# Patient Record
Sex: Male | Born: 1955 | Race: White | Hispanic: No | Marital: Single | Smoking: Current every day smoker
Health system: Southern US, Community
[De-identification: ages and names within clinical notes are randomized; demographics above are authoritative.]

## PROBLEM LIST (undated history)

## (undated) DIAGNOSIS — K5090 Crohn's disease, unspecified, without complications: Secondary | ICD-10-CM

## (undated) HISTORY — DX: Crohn's disease, unspecified, without complications: K50.90

## (undated) HISTORY — PX: SMALL INTESTINE SURGERY: SHX150

---

## 2016-01-29 ENCOUNTER — Encounter: Payer: Self-pay | Admitting: Osteopathic Medicine

## 2016-01-29 ENCOUNTER — Ambulatory Visit (INDEPENDENT_AMBULATORY_CARE_PROVIDER_SITE_OTHER): Payer: Self-pay | Admitting: Osteopathic Medicine

## 2016-01-29 ENCOUNTER — Ambulatory Visit (INDEPENDENT_AMBULATORY_CARE_PROVIDER_SITE_OTHER): Payer: Self-pay

## 2016-01-29 VITALS — BP 135/80 | HR 79 | Ht 70.0 in | Wt 275.0 lb

## 2016-01-29 DIAGNOSIS — M545 Low back pain, unspecified: Secondary | ICD-10-CM

## 2016-01-29 DIAGNOSIS — I998 Other disorder of circulatory system: Secondary | ICD-10-CM

## 2016-01-29 DIAGNOSIS — W11XXXA Fall on and from ladder, initial encounter: Secondary | ICD-10-CM

## 2016-01-29 DIAGNOSIS — M4854XA Collapsed vertebra, not elsewhere classified, thoracic region, initial encounter for fracture: Secondary | ICD-10-CM

## 2016-01-29 DIAGNOSIS — R0781 Pleurodynia: Secondary | ICD-10-CM

## 2016-01-29 DIAGNOSIS — K5090 Crohn's disease, unspecified, without complications: Secondary | ICD-10-CM | POA: Insufficient documentation

## 2016-01-29 DIAGNOSIS — M25511 Pain in right shoulder: Secondary | ICD-10-CM

## 2016-01-29 DIAGNOSIS — M25551 Pain in right hip: Secondary | ICD-10-CM

## 2016-01-29 DIAGNOSIS — K50919 Crohn's disease, unspecified, with unspecified complications: Secondary | ICD-10-CM

## 2016-01-29 DIAGNOSIS — M4856XA Collapsed vertebra, not elsewhere classified, lumbar region, initial encounter for fracture: Secondary | ICD-10-CM

## 2016-01-29 DIAGNOSIS — IMO0001 Reserved for inherently not codable concepts without codable children: Secondary | ICD-10-CM

## 2016-01-29 DIAGNOSIS — F17200 Nicotine dependence, unspecified, uncomplicated: Secondary | ICD-10-CM | POA: Insufficient documentation

## 2016-01-29 DIAGNOSIS — M4850XA Collapsed vertebra, not elsewhere classified, site unspecified, initial encounter for fracture: Secondary | ICD-10-CM

## 2016-01-29 DIAGNOSIS — S46001A Unspecified injury of muscle(s) and tendon(s) of the rotator cuff of right shoulder, initial encounter: Secondary | ICD-10-CM

## 2016-01-29 MED ORDER — CYCLOBENZAPRINE HCL 10 MG PO TABS: ORAL_TABLET | ORAL | 0 refills | Status: DC

## 2016-01-29 MED ORDER — TRAMADOL HCL 50 MG PO TABS: ORAL_TABLET | ORAL | 0 refills | Status: DC

## 2016-01-29 MED ORDER — CYCLOBENZAPRINE HCL 10 MG PO TABS: 10.0000 mg | ORAL_TABLET | Freq: Three times a day (TID) | ORAL | 0 refills | Status: AC | PRN

## 2016-01-29 MED ORDER — TRAMADOL HCL 50 MG PO TABS: 50.0000 mg | ORAL_TABLET | Freq: Four times a day (QID) | ORAL | 0 refills | Status: AC | PRN

## 2016-01-29 NOTE — Progress Notes (Signed)
HPI: Caleb Graham is a 60 y.o. male  who presents to [facility] today, 01/29/16,  for chief complaint of:  Chief Complaint  Patient presents with  . Establish Care    Right arm pain due to a fall     . Context: fall from 10-12 foot fall (20 foot ladder but wasn't all the way up), no head injury or LOC   . Location: R arm can't lift above shoulder, rib pain on R when breathing/moving, low back pain  . Quality: sore/crampy, painful to move, difficulty raising R arm . Severity: doing a bit better than he was, is able to walk but has a lot of pain  . Duration: 9 days ago  . Assoc signs/symptoms: No numbness or tingling, no saddle anesthesia    Past medical, surgical, social and family history reviewed: Past Medical History:  Diagnosis Date  . Crohn disease [facility])    Past Surgical History:  Procedure Laterality Date  . SMALL INTESTINE SURGERY     30YEARS AGO   Social History  Substance Use Topics  . Smoking status: Current Every Day Smoker  . Smokeless tobacco: Never Used  . Alcohol use Not on file   Family History  Problem Relation Age of Onset  . Diabetes Maternal Grandmother      Current medication list and allergy/intolerance information reviewed:   No current outpatient prescriptions on file.   No current facility-administered medications for this visit.    No Known Allergies    Review of Systems:  Constitutional:  No  fever, no chills, No recent illness, No unintentional weight changes. No significant fatigue.   HEENT: No  headache, no vision change, no hearing change, No sore throat, No  sinus pressure  Cardiac: No  chest pain, No  pressure, No palpitations, No  Orthopnea  Respiratory:  No  shortness of breath. No  Cough  Gastrointestinal: No  abdominal pain, No  nausea, No  vomiting,  No  blood in stool, No  diarrhea, No  constipation   Musculoskeletal: (+)new myalgia/arthralgia  Genitourinary: No  incontinence, No   abnormal genital bleeding, No abnormal genital discharge  Skin: No  Rash, No other wounds/concerning lesions  Hem/Onc: No  easy bruising/bleeding, No  abnormal lymph node  Endocrine: No cold intolerance,  No heat intolerance. No polyuria/polydipsia/polyphagia   Neurologic: No  weakness, No  dizziness, No  slurred speech/focal weakness/facial droop  Psychiatric: No  concerns with depression, No  concerns with anxiety, No sleep problems, No mood problems  Exam:  BP 135/80   Pulse 79   Ht 5\' 10"  (1.778 m)   Wt 275 lb (124.7 kg)   BMI 39.46 kg/m   Constitutional: VS see above. General Appearance: alert, well-developed, well-nourished, NAD  Eyes: Normal lids and conjunctive, non-icteric sclera  Ears, Nose, Mouth, Throat: MMM, Normal external inspection ears/nares/mouth/lips/gums.  Neck: No masses, trachea midline. No thyroid enlargement. Normal neck range of motion, nontender C-spine  Respiratory: Normal respiratory effort. no wheeze, no rhonchi, no rales  Cardiovascular: S1/S2 normal, no murmur, no rub/gallop auscultated. RRR. No lower extremity edema but does appear to be some venous stasis changes in the skin  Gastrointestinal: Habitus limits exam. Nontender, no masses. No hepatomegaly, no splenomegaly. No hernia appreciated. Bowel sounds normal. Rectal exam deferred.   Musculoskeletal: Gait normal. No clubbing/cyanosis of digits. Paraspinal tenderness bilateral lower back, positives. Leg raise causes lower back pain, no sciatica symptoms. Significant bruising on right hip, pelvis stable, does have  some tenderness at she with bone. Normal grip excursion on inhalation. Positive drop arm test on right shoulder, difficult to say whether due to pain or weakness  Neurological: No cranial nerve deficit on limited exam. Able to move all 4 extremities independently. Cerebellar reflexes intact. Normal balance/coordination. No tremor.   Skin: warm, dry, intact. No rash/ulcer. No concerning  nevi or subq nodules on limited exam.  Positive bruising/ecchymoses on right hip  Psychiatric: Normal judgment/insight. Normal mood and affect. Oriented x3.    No results found for this or any previous visit (from the past 72 hour(s)).  Dg Ribs Unilateral W/chest Right  Result Date: 01/29/2016 CLINICAL DATA:  Fall. EXAM: RIGHT RIBS AND CHEST - 3+ VIEW COMPARISON:  No recent prior. FINDINGS: No evidence of fracture dislocation. No pneumothorax. Degenerative changes thoracic spine and both shoulders. Carotid vascular calcification. IMPRESSION: 1. No acute abnormality. Degenerative changes thoracic spine and both shoulders. 2. Carotid vascular disease. Electronically Signed   By: Maisie Fus  Register   On: 01/29/2016 11:33   Dg Lumbar Spine Complete  Result Date: 01/29/2016 CLINICAL DATA:  Fall. EXAM: LUMBAR SPINE - COMPLETE 4+ VIEW COMPARISON:  No recent prior. FINDINGS: Diffuse multilevel degenerative changes thoracolumbar spine. Mild compression fractures of T10, T11, T12, L1, and L4 noted. Age of compression fractures undetermined. IMPRESSION: 1. Mild compression fractures at T10, T11, T12, L1, L4 noted. Age of compression fractures is indeterminate. 2. Diffuse degenerative change. Electronically Signed   By: Maisie Fus  Register   On: 01/29/2016 11:36   Dg Shoulder Right  Result Date: 01/29/2016 CLINICAL DATA:  Fall.  Pain EXAM: RIGHT SHOULDER - 2+ VIEW COMPARISON:  None. FINDINGS: No acute soft tissue bony abnormality identified. 2 ounce of fracture. Degenerative changes present about the acromioclavicular glenohumeral joints. Calcification of the supraspinatus tendon this region consistent with calcific supraspinatus tendinosis. IMPRESSION: Degenerative changes right shoulder. Changes of calcific supraspinatus tendinosis. No evidence of fracture or dislocation. Electronically Signed   By: Maisie Fus  Register   On: 01/29/2016 11:31   Dg Hip Unilat W Or W/o Pelvis 2-3 Views Right  Result Date:  01/29/2016 CLINICAL DATA:  Fall. EXAM: DG HIP (WITH OR WITHOUT PELVIS) 2-3V RIGHT COMPARISON:  No recent prior. FINDINGS: Surgical clip noted in the pelvis. Degenerative changes lumbar spine and both hips. No acute bony or joint abnormality identified. IMPRESSION: Degenerative changes lumbar spine and both hips. Electronically Signed   By: Maisie Fus  Register   On: 01/29/2016 11:35   X-rays personally reviewed, nothing to add and no concerns for fracture, appreciate the assistance of Dr. Denyse Amass in preliminary review of these reports prior to radiology over read   ASSESSMENT/PLAN:   Patient is reluctant to proceed with MRI for further workup given cost and that he is uninsured. Patient is educated on the risk of inadequate diagnosis/inadequate estimation of severity, this may be something that would require surgery. I cannot accurately estimate patient's risk/recovery expectation without further evaluation. Patient opts to trial home exercises, again physical therapy affordability is a problem for him. Patient is aware of risks versus benefits of foregoing further workup with MRI/physical therapy at this time.  Attempted to call patient regarding official radiology read for compression fractures, left a voicemail for him to call clinic back 01/30/16 5:33 PM   Advised patient that if no improvement, at the very least could consider sports medicine evaluation, they may be able to at least provide some pain relief with injections of the shoulder if no improvement, or discuss other alternatives as  they see fit.  Fall from ladder, initial encounter - Plan: DG Shoulder Right, DG Ribs Unilateral W/Chest Right, DG HIP UNILAT W OR W/O PELVIS 2-3 VIEWS RIGHT, DG Lumbar Spine Complete  Pain in joint of right shoulder - Plan: DG Shoulder Right, traMADol (ULTRAM) 50 MG tablet, cyclobenzaprine (FLEXERIL) 10 MG tablet, DISCONTINUED: traMADol (ULTRAM) 50 MG tablet, DISCONTINUED: cyclobenzaprine (FLEXERIL) 10 MG  tablet  Rib pain on right side - Plan: DG Ribs Unilateral W/Chest Right, traMADol (ULTRAM) 50 MG tablet, cyclobenzaprine (FLEXERIL) 10 MG tablet, DISCONTINUED: traMADol (ULTRAM) 50 MG tablet, DISCONTINUED: cyclobenzaprine (FLEXERIL) 10 MG tablet  Hip pain, acute, right - Plan: DG HIP UNILAT W OR W/O PELVIS 2-3 VIEWS RIGHT, traMADol (ULTRAM) 50 MG tablet, cyclobenzaprine (FLEXERIL) 10 MG tablet, DISCONTINUED: traMADol (ULTRAM) 50 MG tablet, DISCONTINUED: cyclobenzaprine (FLEXERIL) 10 MG tablet  Bilateral low back pain without sciatica - Plan: DG Lumbar Spine Complete, traMADol (ULTRAM) 50 MG tablet, cyclobenzaprine (FLEXERIL) 10 MG tablet, DISCONTINUED: traMADol (ULTRAM) 50 MG tablet, DISCONTINUED: cyclobenzaprine (FLEXERIL) 10 MG tablet  Rotator cuff injury, right, initial encounter  Compression fracture of spine, initial encounter (HCC) - T10, T11, T12, L1, L4 - age indeterminate, based on x-ray from 01/29/2016  Tobacco dependence  Crohn's disease with complication, unspecified gastrointestinal tract location [facility])    Visit summary with medication list and pertinent instructions was printed for patient to review. All questions at time of visit were answered - patient instructed to contact office with any additional concerns. ER/RTC precautions were reviewed with the patient. Follow-up plan: Return in about 1 week (around 02/05/2016) for with Dr Denyse Amass for sports medicine / shoulder evaluation if still in pain .

## 2016-01-30 DIAGNOSIS — S46009A Unspecified injury of muscle(s) and tendon(s) of the rotator cuff of unspecified shoulder, initial encounter: Secondary | ICD-10-CM | POA: Insufficient documentation

## 2016-01-30 DIAGNOSIS — W11XXXA Fall on and from ladder, initial encounter: Secondary | ICD-10-CM | POA: Insufficient documentation

## 2016-01-30 DIAGNOSIS — M4850XA Collapsed vertebra, not elsewhere classified, site unspecified, initial encounter for fracture: Secondary | ICD-10-CM | POA: Insufficient documentation

## 2016-07-06 ENCOUNTER — Ambulatory Visit: Payer: Self-pay | Admitting: Osteopathic Medicine

## 2016-07-06 ENCOUNTER — Telehealth: Payer: Self-pay | Admitting: Osteopathic Medicine

## 2016-07-06 DIAGNOSIS — N5089 Other specified disorders of the male genital organs: Secondary | ICD-10-CM

## 2016-07-06 NOTE — Telephone Encounter (Signed)
Critical staff has called the patient and left voicemail to ask him to come in ASAP for scrotal ultrasound preferably prior to his appointment.

## 2016-07-07 ENCOUNTER — Other Ambulatory Visit: Payer: Self-pay | Admitting: Osteopathic Medicine

## 2016-07-07 DIAGNOSIS — N5089 Other specified disorders of the male genital organs: Secondary | ICD-10-CM

## 2016-08-11 ENCOUNTER — Encounter: Payer: Self-pay | Admitting: Osteopathic Medicine

## 2016-08-11 ENCOUNTER — Ambulatory Visit (INDEPENDENT_AMBULATORY_CARE_PROVIDER_SITE_OTHER): Payer: BLUE CROSS/BLUE SHIELD | Admitting: Osteopathic Medicine

## 2016-08-11 VITALS — BP 101/66 | HR 81 | Ht 70.0 in | Wt 268.0 lb

## 2016-08-11 DIAGNOSIS — E1159 Type 2 diabetes mellitus with other circulatory complications: Secondary | ICD-10-CM | POA: Diagnosis not present

## 2016-08-11 DIAGNOSIS — I5032 Chronic diastolic (congestive) heart failure: Secondary | ICD-10-CM | POA: Diagnosis not present

## 2016-08-11 DIAGNOSIS — IMO0002 Reserved for concepts with insufficient information to code with codable children: Secondary | ICD-10-CM

## 2016-08-11 DIAGNOSIS — G4730 Sleep apnea, unspecified: Secondary | ICD-10-CM

## 2016-08-11 DIAGNOSIS — J449 Chronic obstructive pulmonary disease, unspecified: Secondary | ICD-10-CM | POA: Diagnosis not present

## 2016-08-11 DIAGNOSIS — E1165 Type 2 diabetes mellitus with hyperglycemia: Secondary | ICD-10-CM | POA: Insufficient documentation

## 2016-08-11 MED ORDER — FUROSEMIDE 40 MG PO TABS: 40.0000 mg | ORAL_TABLET | Freq: Every day | ORAL | 1 refills | Status: AC

## 2016-08-11 MED ORDER — UMECLIDINIUM-VILANTEROL 62.5-25 MCG/INH IN AEPB: 1.0000 | INHALATION_SPRAY | Freq: Every day | RESPIRATORY_TRACT | 5 refills | Status: DC

## 2016-08-11 NOTE — Patient Instructions (Addendum)
For lungs: Use the oxygen as much as you're able Will get nebulizer medications due to cost of inhalers - ask your lung doctor if they have samples or can recommend any other inhalers Ask your lung doctor if you should have a sleep study 09/14/16 is your appointment with the lung doctor

## 2016-08-11 NOTE — Progress Notes (Signed)
HPI: Caleb Graham is a 61 y.o. male  who presents to [facility] today, 08/11/16,  for chief complaint of:  Chief Complaint  Patient presents with  . Hospitalization Follow-up    FLUID    Here today for hospital follow-up. Was hospitalized back in January for fluid overload, presumably due to diastolic cardiac dysfunction, complicated by COPD with acute respiratory failure and open wound of right ankle. Patient overall is feeling well today. He states the swelling has decreased her medically, his breathing has improved though he still is trying to some coughing. He is on presumably a CPAP machine at night, states that he has been sleeping much better since starting this device. Has not been using the oxygen, has some questions about whether or not he really needs to be taking this area did has not started the inhaler medications due to cost. She is taking the Lasix as directed, is almost out of this medication.   Hospitalization records reviewed. Patient was admitted 07/08/2016, discharged 07/24/2016. Total length of stay was 16 days. Diagnoses included COPD, respiratory failure with hypoxia/hypercapnia, elevated troponin, volume overload, additional concerns including microscopic hematuria, toe ulcer, right ankle wound. On initial presentation to the hospital was complaining of lower extremity swelling worsening over the past few weeks, blood gas showed hypercarbic respiratory failure with acidosis, patient was on BiPAP. Diagnosed with acute diastolic heart failure, was diuresed with Lasix, intermittent metolazone. Admission weight was 340 discharge weight was 274. LVEF 60-65% with normal wall motion, discharged on furosemide daily 40 mg. CXR (+) cardiomegaly. Recommendation for formal sleep study in the outpatient setting. Presumed COPD based on pulmonary function test in the hospital, responded well to treatment with bronchodilators and steroids. Following up with  [facility] chest outpatient. Fracture of the second left toe with exposed tendons on admission, imaging showed no evidence of osteomyelitis, infectious disease and podiatry were consulted, recommended 6 weeks of antibiotics.   Was discharged on albuterol-ipratropium inhaler 4 times a day when necessary, umeclidinium-vilanterol daily, Lasix 40 mg daily, except he was also on IV antibiotics vancomycin and ceftriaxone,  po Flagyl. Has follow-up in place with infectious disease and with pulmonology. Patient did not get the inhaled medications filled due to cost  Echocardiogram 07/09/2016: Mild concentric LVH, normal wall motion, LVEF 60-65%. Right ventricle mildly dilated, RVEF grossly normal. Not sure patient's fluid status at this point that was a day after his admission   Past medical history, surgical history, social history and family history reviewed.  Patient Active Problem List   Diagnosis Date Noted  . Compression fracture of spine (HCC) 01/30/2016  . Rotator cuff injury 01/30/2016  . Fall from ladder 01/30/2016  . Crohn's disease (HCC) 01/29/2016  . Tobacco dependence 01/29/2016    Current medication list and allergy/intolerance information reviewed.   Current Outpatient Prescriptions on File Prior to Visit  Medication Sig Dispense Refill  . cyclobenzaprine (FLEXERIL) 10 MG tablet Take 1 tablet (10 mg total) by mouth 3 (three) times daily as needed for muscle spasms. 30 tablet 0  . traMADol (ULTRAM) 50 MG tablet Take 1 tablet (50 mg total) by mouth every 6 (six) hours as needed. 30 tablet 0   No current facility-administered medications on file prior to visit.    No Known Allergies    Review of Systems:   Constitutional: +recent illness  HEENT: No  headache, no vision change  Cardiac: No  chest pain, No  pressure, No palpitations  Respiratory:  No  shortness of breath. +Cough  Gastrointestinal: No  abdominal pain, no change on bowel habits  Skin: No  Rash  Neurologic: No   weakness, No  Dizziness   Exam:  BP 101/66   Pulse 81   Ht 5\' 10"  (1.778 m)   Wt 268 lb (121.6 kg)   BMI 38.45 kg/m   Constitutional: VS see above. General Appearance: alert, well-developed, well-nourished, NAD  Neck: No masses, trachea midline. No JVD  Respiratory: Normal respiratory effort. no wheeze, no rhonchi, no rales  Cardiovascular: S1/S2 normal, no rub/gallop auscultated. RRR.   Musculoskeletal: Gait normal. Symmetric and independent movement of all extremities  Neurological: Normal balance/coordination. No tremor.  Skin: warm, dry, intact. Skin changes on bilateral lower extremities consistent with peripheral vascular disease  Psychiatric: Normal judgment/insight. Normal mood and affect. Oriented x3.   Lab results reviewed: 08/06/2016: CBC no concerns, decreased platelet count CMP glucose up and down throughout hospitalization, CO2 elevated though improved by time of discharge, serum creatinine 0.91 at time of discharge. Hemoglobin A1c 6.8  ASSESSMENT/PLAN:   Chronic diastolic heart failure (HCC) - Significant fluid overload in light of normal LVEF, concern for diastolic dysfunction, doing well on Lasix, blood pressure is controlled  Chronic obstructive pulmonary disease, unspecified COPD type (HCC) - We'll try to get savings program coupon for him for inhaled medications, advised this plus oxygen would benefit, follow-up with pulmonology  Sleep apnea, unspecified type - Continue home breathing apparatus, patient not sure whether CPAP but it sounds like this or BiPAP, follow-up with pulm  Uncontrolled type 2 diabetes mellitus with other circulatory complication, without long-term current use of insulin (HCC) - Reviewed labs in detail after patient had left the office, will have him follow-up on this    Patient Instructions  For lungs: Use the oxygen as much as you're able Will get nebulizer medications due to cost of inhalers - ask your lung doctor if they have  samples or can recommend any other inhalers Ask your lung doctor if you should have a sleep study 09/14/16 is your appointment with the lung doctor     Follow-up plan: Return in about 6 weeks (around 09/22/2016) for followup on BP and breathing and swelling .  Visit summary with medication list and pertinent instructions was printed for patient to review, alert [place] if any changes needed. All questions at time of visit were answered - patient instructed to contact office with any additional concerns. ER/RTC precautions were reviewed with the patient and understanding verbalized.   Note: Total time spent 40 minutes, greater than 50% of the visit was spent face-to-face counseling and coordinating care for the following: The primary encounter diagnosis was Chronic diastolic heart failure (HCC). Diagnoses of Chronic obstructive pulmonary disease, unspecified COPD type (HCC), Sleep apnea, unspecified type, and Uncontrolled type 2 diabetes mellitus with other circulatory complication, without long-term current use of insulin (HCC) were also pertinent to this visit.Marland Kitchen

## 2016-09-23 ENCOUNTER — Encounter: Payer: Self-pay | Admitting: Osteopathic Medicine

## 2016-09-23 ENCOUNTER — Ambulatory Visit (INDEPENDENT_AMBULATORY_CARE_PROVIDER_SITE_OTHER): Payer: BLUE CROSS/BLUE SHIELD | Admitting: Osteopathic Medicine

## 2016-09-23 VITALS — BP 130/69 | HR 88 | Wt 274.0 lb

## 2016-09-23 DIAGNOSIS — I5032 Chronic diastolic (congestive) heart failure: Secondary | ICD-10-CM

## 2016-09-23 DIAGNOSIS — J441 Chronic obstructive pulmonary disease with (acute) exacerbation: Secondary | ICD-10-CM

## 2016-09-23 DIAGNOSIS — R7301 Impaired fasting glucose: Secondary | ICD-10-CM

## 2016-09-23 DIAGNOSIS — G4730 Sleep apnea, unspecified: Secondary | ICD-10-CM | POA: Diagnosis not present

## 2016-09-23 DIAGNOSIS — J449 Chronic obstructive pulmonary disease, unspecified: Secondary | ICD-10-CM | POA: Diagnosis not present

## 2016-09-23 MED ORDER — NEBULIZER/TUBING/MOUTHPIECE KIT: PACK | 1 refills | Status: AC

## 2016-09-23 MED ORDER — IPRATROPIUM-ALBUTEROL 0.5-2.5 (3) MG/3ML IN SOLN: RESPIRATORY_TRACT | 5 refills | Status: AC

## 2016-09-23 MED ORDER — AZITHROMYCIN 250 MG PO TABS: ORAL_TABLET | ORAL | 0 refills | Status: AC

## 2016-09-23 MED ORDER — ALBUTEROL SULFATE HFA 108 (90 BASE) MCG/ACT IN AERS: 2.0000 | INHALATION_SPRAY | Freq: Four times a day (QID) | RESPIRATORY_TRACT | 11 refills | Status: AC | PRN

## 2016-09-23 MED ORDER — PREDNISONE 20 MG PO TABS: 20.0000 mg | ORAL_TABLET | Freq: Two times a day (BID) | ORAL | 0 refills | Status: AC

## 2016-09-23 NOTE — Progress Notes (Signed)
HPI: Caleb Graham is a 61 y.o. male  who presents to [place] today, 09/23/16,  for chief complaint of:  No chief complaint on file.   A1C in hospital was 6.8%, no previous A1C.   COPD: cost of inhalers was an issue at last visit. Patient regarding inhaled medication still. He is open to the idea of using nebulizer machine. Has not followed up with pulmonology, thinks he had to reschedule this appointment, I don't see anything in the system for him. Reports cough/cold symptoms for the past 3-4 days. No fever or shortness of breath, no mucopurulent sputum.  OSA: confusion whether on CPAP/BiPAP? At any rate, patient states the mask he is using for sleep is helping a good deal. Occasionally he will wear during the day. He is not using home oxygen as directed.  CHF/diastolic dysfunction: Hospitalized back in January for fluid overload, complicated by COPD. He is not weighing himself daily. He is taking the Lasix but this is the only medication he is using. No chest pain/pressure/palpitations. Echocardiogram 07/09/2016 showed mild concentric LVH, EF 60-65%, right ventricle mildly dilated     Past medical history, surgical history, social history and family history reviewed.  Patient Active Problem List   Diagnosis Date Noted  . Chronic diastolic heart failure ([place]) 08/11/2016  . Chronic obstructive pulmonary disease ([place]) 08/11/2016  . Sleep apnea 08/11/2016  . Type II diabetes mellitus, uncontrolled ([place]) 08/11/2016  . Compression fracture of spine ([place]) 01/30/2016  . Rotator cuff injury 01/30/2016  . Fall from ladder 01/30/2016  . Crohn's disease ([place]) 01/29/2016  . Tobacco dependence 01/29/2016    Current medication list and allergy/intolerance information reviewed.   Current Outpatient Prescriptions on File Prior to Visit  Medication Sig Dispense Refill  . cyclobenzaprine (FLEXERIL) 10 MG tablet Take 1 tablet (10 mg total) by mouth 3 (three)  times daily as needed for muscle spasms. 30 tablet 0  . furosemide (LASIX) 40 MG tablet Take 1 tablet (40 mg total) by mouth daily. 90 tablet 1  . traMADol (ULTRAM) 50 MG tablet Take 1 tablet (50 mg total) by mouth every 6 (six) hours as needed. 30 tablet 0  . umeclidinium-vilanterol (ANORO ELLIPTA) 62.5-25 MCG/INH AEPB Inhale 1 puff into the lungs daily. 60 each 5   No current facility-administered medications on file prior to visit.    No Known Allergies    Review of Systems:   Constitutional: +recent illness  HEENT: No  headache, no vision change  Cardiac: No  chest pain, No  pressure, No palpitations  Respiratory:  No  shortness of breath. +Cough  Gastrointestinal: No  abdominal pain, no change on bowel habits  Skin: No  Rash  Neurologic: No  weakness, No  Dizziness   Exam:  BP 130/69   Pulse 88   Wt 274 lb (124.3 kg)   SpO2 96%   BMI 39.31 kg/m   Constitutional: VS see above. General Appearance: alert, well-developed, well-nourished, NAD  Neck: No masses, trachea midline. No JVD  Respiratory: Normal respiratory effort. no wheeze, no rhonchi, no rales. Diminished breath sounds bilaterally.  Cardiovascular: S1/S2 normal, no rub/gallop auscultated. RRR.   Musculoskeletal: Gait normal. Symmetric and independent movement of all extremities  Neurological: Normal balance/coordination. No tremor.  Skin: warm, dry, intact. Skin changes on bilateral lower extremities consistent with peripheral vascular disease  Psychiatric: Normal judgment/insight. Normal mood and affect. Oriented x3.   Lab results reviewed: 08/06/2016: CBC no concerns, decreased platelet count CMP glucose  up and down throughout hospitalization, CO2 elevated though improved by time of discharge, serum creatinine 0.91 at time of discharge. Hemoglobin A1c 6.8  ASSESSMENT/PLAN:   Chronic obstructive pulmonary disease, unspecified COPD type (HCC) - Trial of nebulized medications as inhalers are  prohibitively expensive. Advise follow-up with pulmonology - Plan: albuterol (PROVENTIL HFA;VENTOLIN HFA) 108 (90 Base) MCG/ACT inhaler, Respiratory Therapy Supplies (NEBULIZER/TUBING/MOUTHPIECE) KIT, ipratropium-albuterol (DUONEB) 0.5-2.5 (3) MG/3ML SOLN  Chronic diastolic congestive heart failure ([place]) - Plan: COMPLETE METABOLIC PANEL WITH GFR  Elevated fasting glucose - Recheck A1c in 6 weeks, advised low carb diet and exercise as tolerated  Sleep apnea, unspecified type - Advise follow-up with pulmonology for further management/monitoring  COPD exacerbation ([place]) - Inhale medications as above plus steroids 5 days, fill antibiotics if no improvement/if cough worse or fever - Plan: predniSONE (DELTASONE) 20 MG tablet, azithromycin (ZITHROMAX) 250 MG tablet    Patient Instructions  Plan: 1. Nebulizer twice per day, more as needed 2. Albuterol rescue inhaler as needed when not at home 3. Steroids for acute illness now 4. Fill antibiotics if no better over the weekend  5. Labs today 6. Recheck sugars in 1-2 months in the office      Follow-up plan: Return in about 6 weeks (around 11/04/2016) for recheck blood sugars .  Visit summary with medication list and pertinent instructions was printed for patient to review, alert [place] if any changes needed. All questions at time of visit were answered - patient instructed to contact office with any additional concerns. ER/RTC precautions were reviewed with the patient and understanding verbalized.

## 2016-09-23 NOTE — Patient Instructions (Signed)
Plan: 1. Nebulizer twice per day, more as needed 2. Albuterol rescue inhaler as needed when not at home 3. Steroids for acute illness now 4. Fill antibiotics if no better over the weekend  5. Labs today 6. Recheck sugars in 1-2 months in the office

## 2016-09-24 LAB — COMPLETE METABOLIC PANEL WITH GFR
ALT: 7 U/L — ABNORMAL LOW (ref 9–46)
AST: 13 U/L (ref 10–35)
Albumin: 3.6 g/dL (ref 3.6–5.1)
Alkaline Phosphatase: 42 U/L (ref 40–115)
BUN: 10 mg/dL (ref 7–25)
CHLORIDE: 101 mmol/L (ref 98–110)
CO2: 33 mmol/L — AB (ref 20–31)
Calcium: 9.4 mg/dL (ref 8.6–10.3)
Creat: 0.84 mg/dL (ref 0.70–1.25)
Glucose, Bld: 115 mg/dL — ABNORMAL HIGH (ref 65–99)
POTASSIUM: 4.4 mmol/L (ref 3.5–5.3)
Sodium: 141 mmol/L (ref 135–146)
Total Bilirubin: 0.4 mg/dL (ref 0.2–1.2)
Total Protein: 6.8 g/dL (ref 6.1–8.1)

## 2016-11-23 ENCOUNTER — Ambulatory Visit: Payer: BLUE CROSS/BLUE SHIELD | Admitting: Osteopathic Medicine

## 2016-12-11 IMAGING — DX DG HIP (WITH OR WITHOUT PELVIS) 2-3V*R*
4 series · 4 of 4 positions shown · non-contrast
Comparison: No recent prior.

CLINICAL DATA: Fall.

EXAM:
DG HIP (WITH OR WITHOUT PELVIS) 2-3V RIGHT

[pelvis ap (1 of 2)]
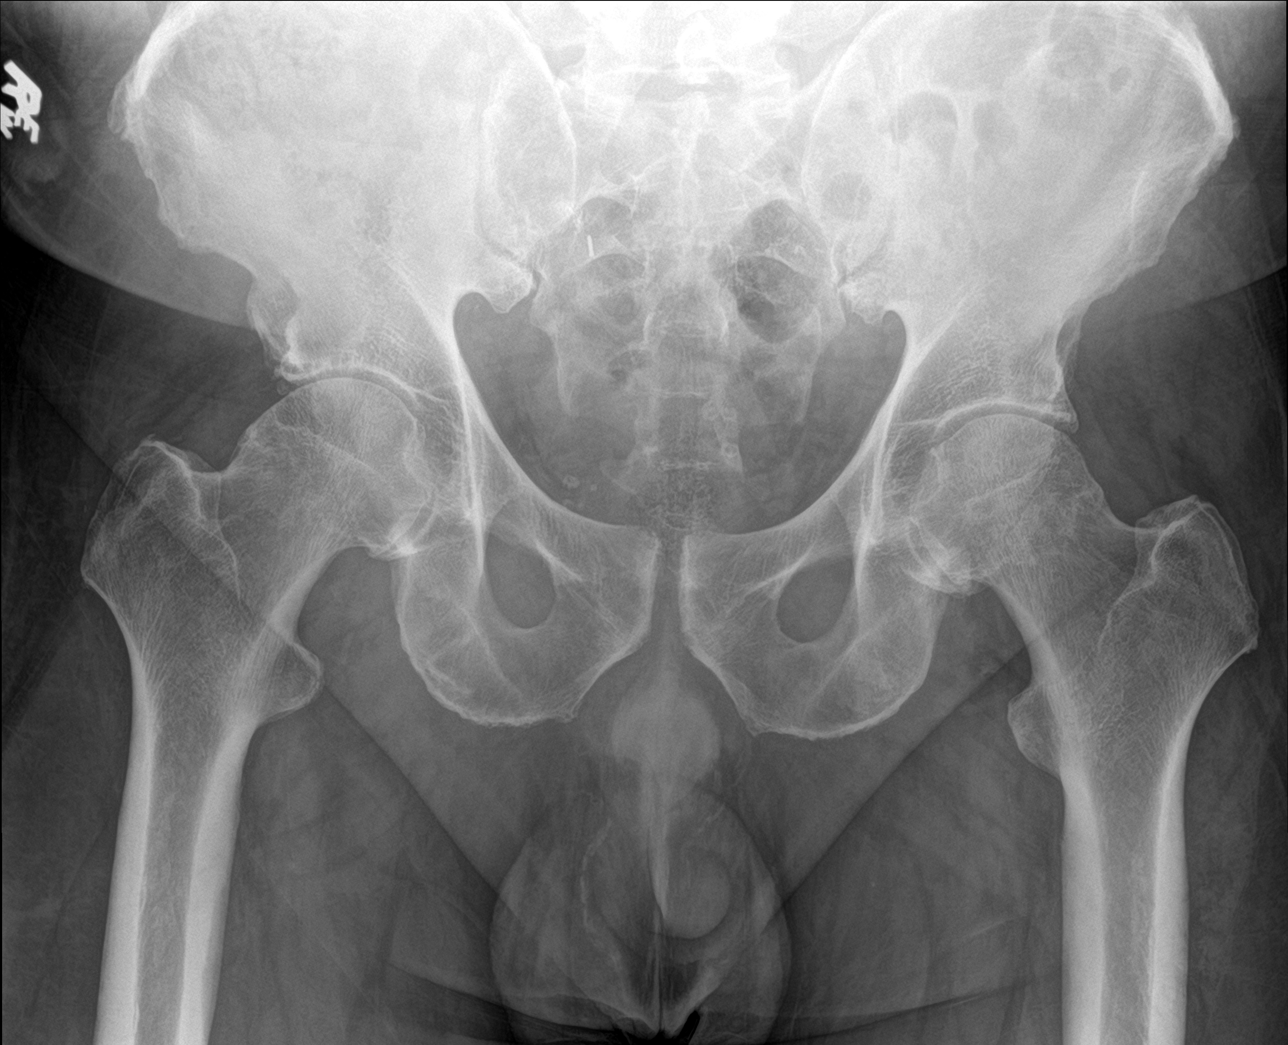

[hip ap]
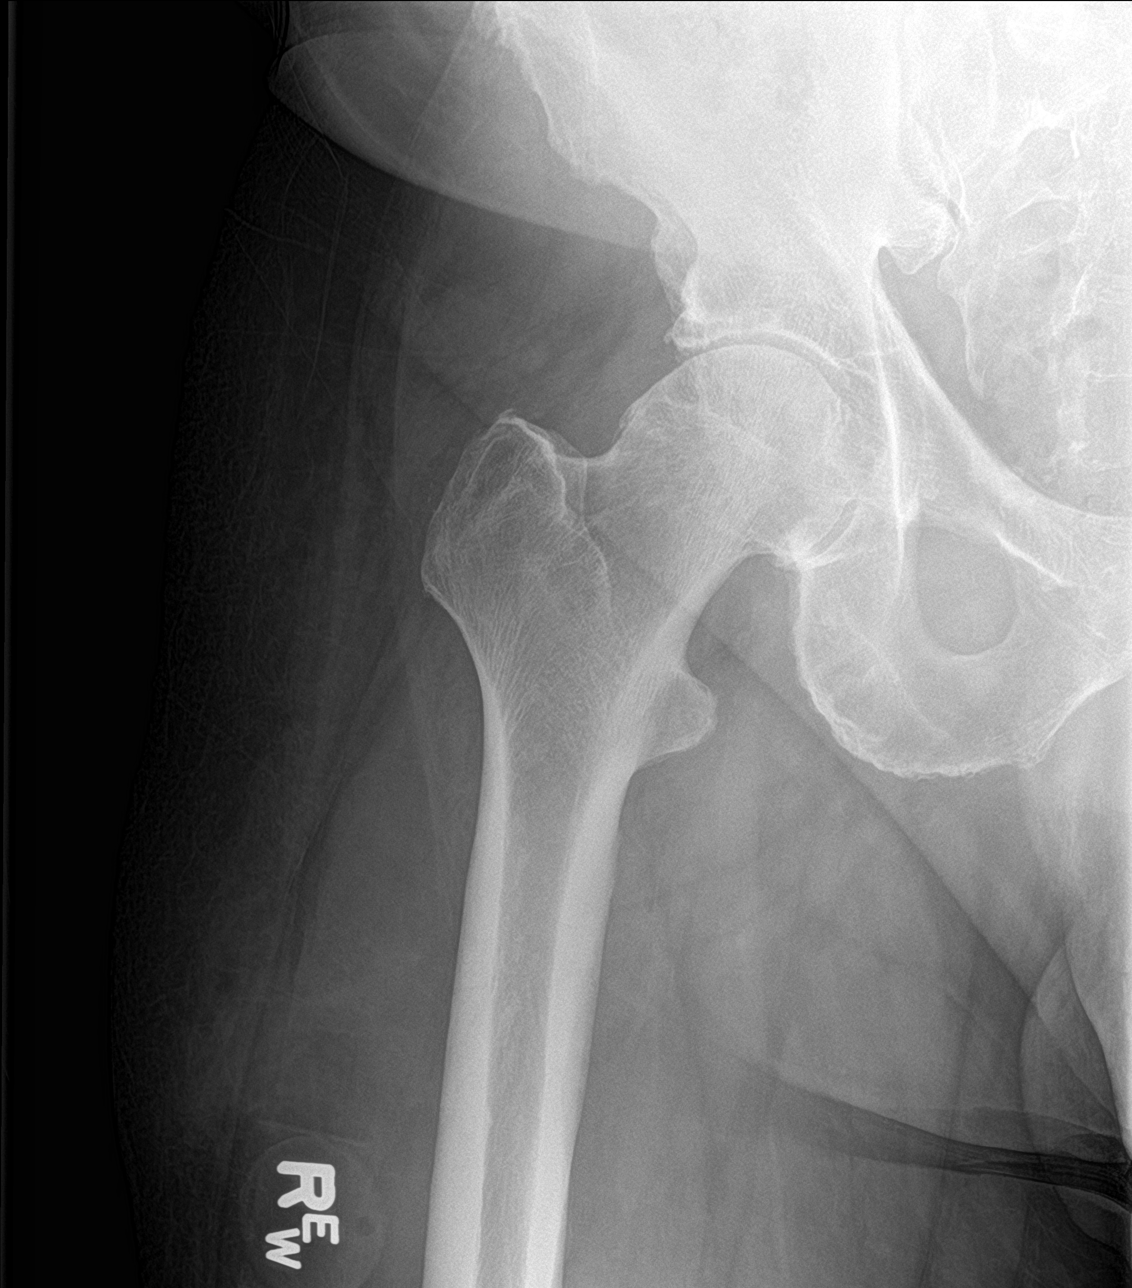

[hip lat]
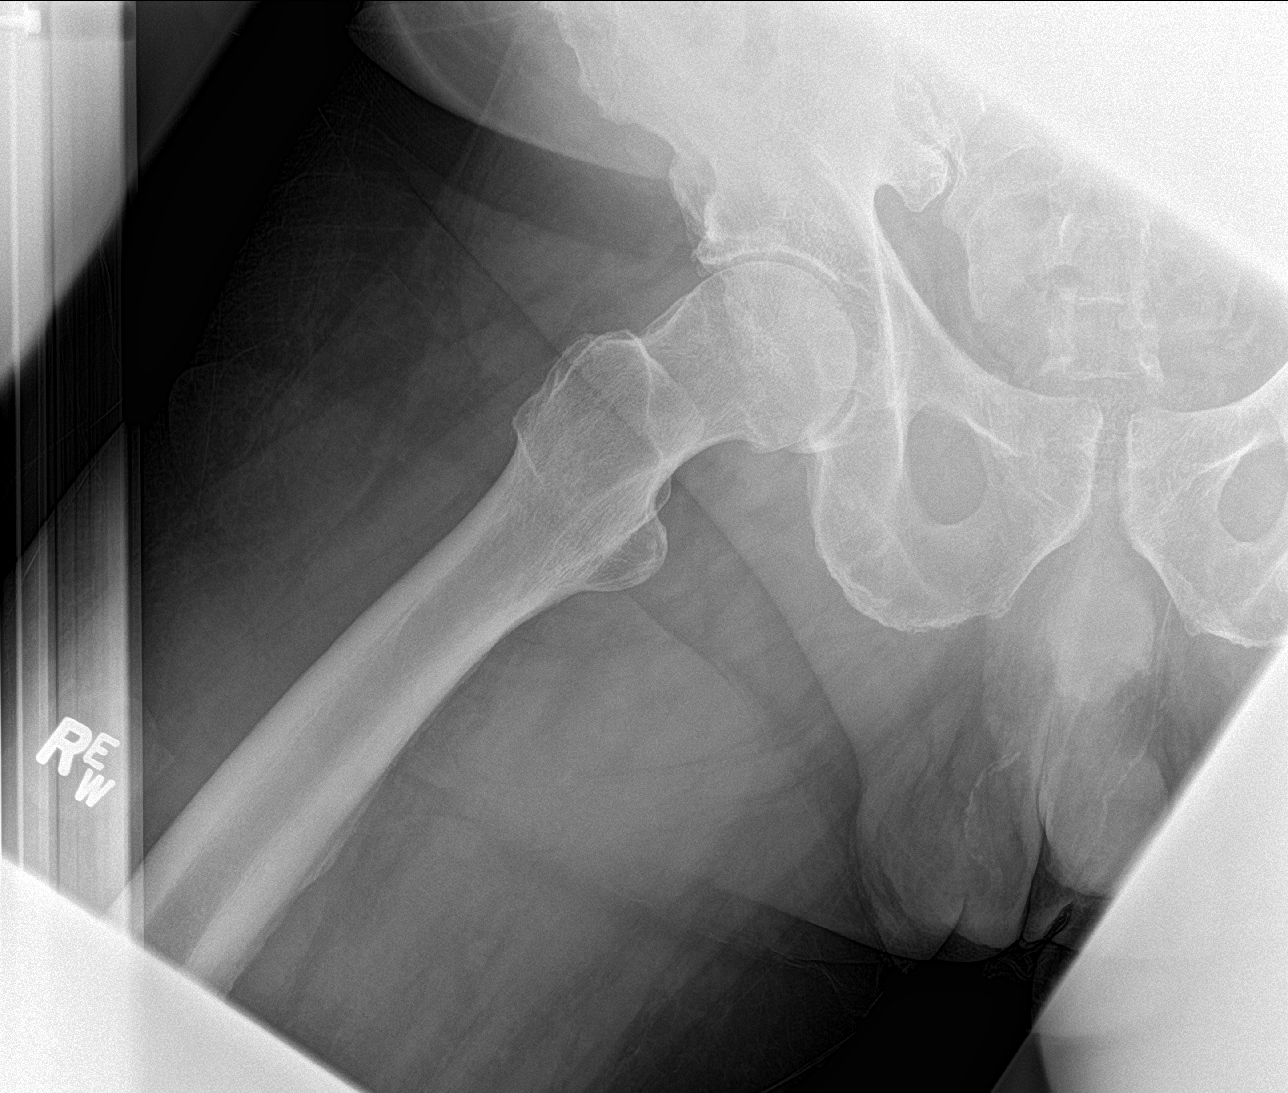

[pelvis ap (2 of 2)]
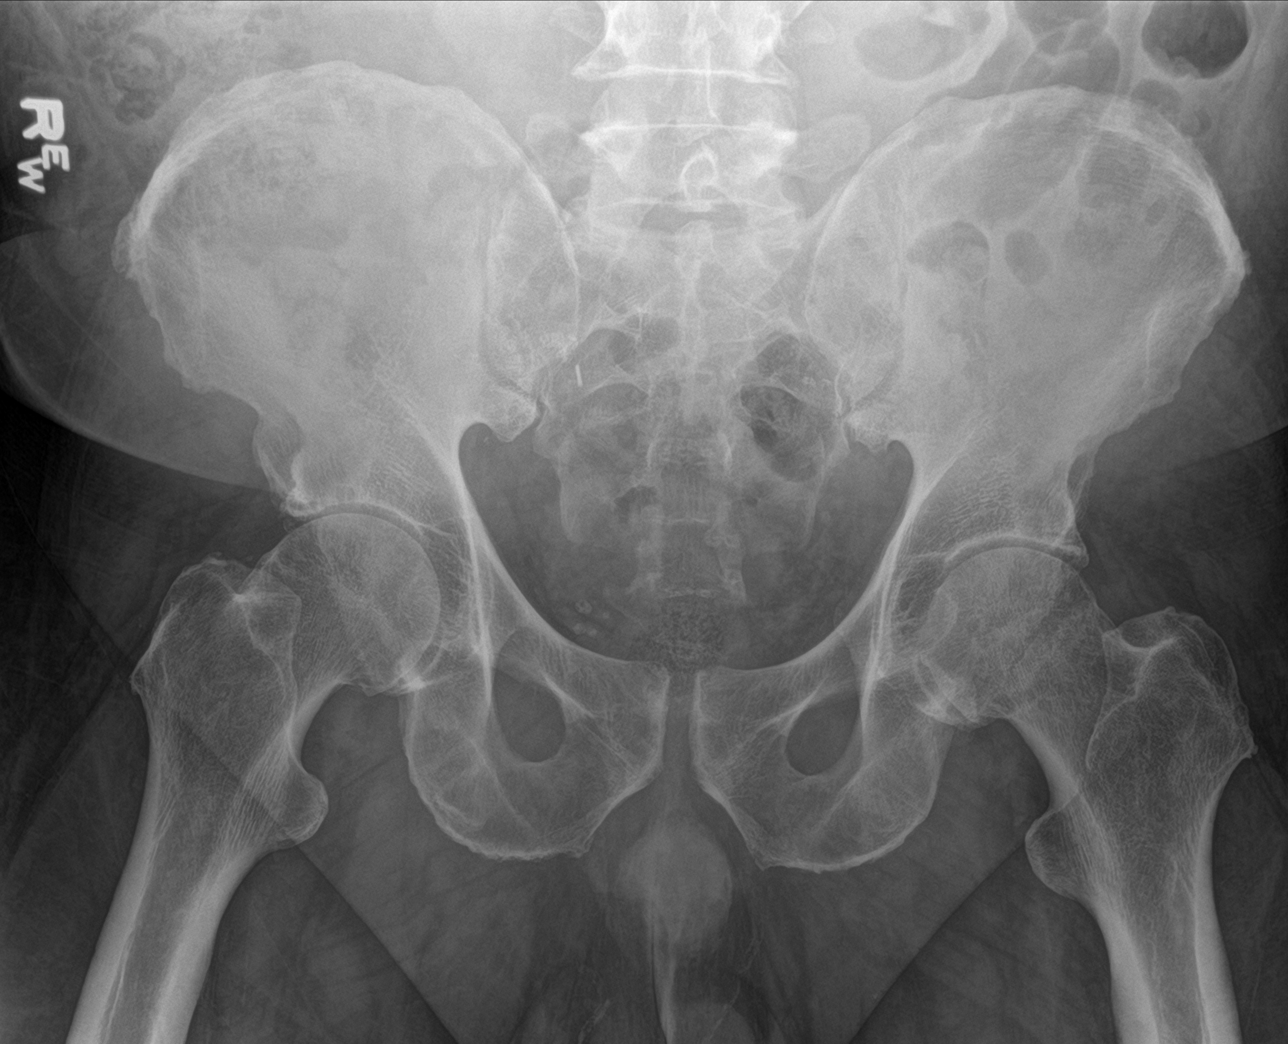

[4 of 4 positions shown; findings below may reference images not displayed]

FINDINGS: Surgical clip noted in the pelvis. Degenerative changes lumbar spine
and both hips. No acute bony or joint abnormality identified.
IMPRESSION: Degenerative changes lumbar spine and both hips.

## 2016-12-11 IMAGING — DX DG RIBS W/ CHEST 3+V*R*
4 series · 4 of 4 positions shown · non-contrast
Comparison: No recent prior.

CLINICAL DATA: Fall.

EXAM:
RIGHT RIBS AND CHEST - 3+ VIEW

[chest pa]
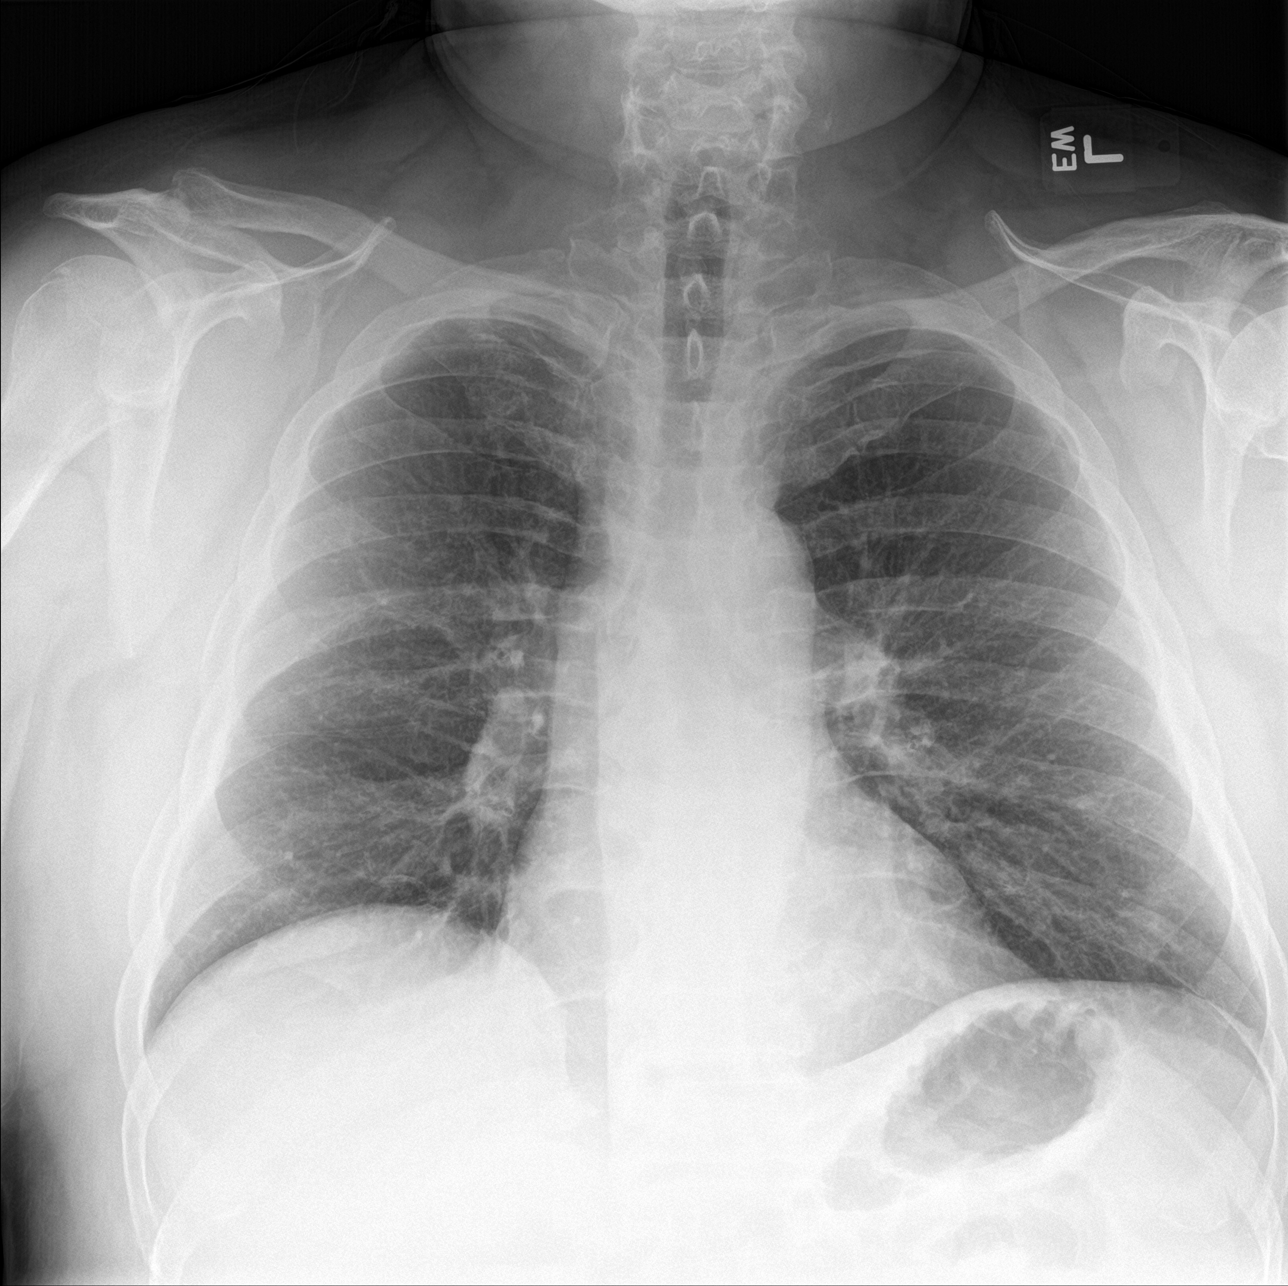

[rib ap (1 of 2)]
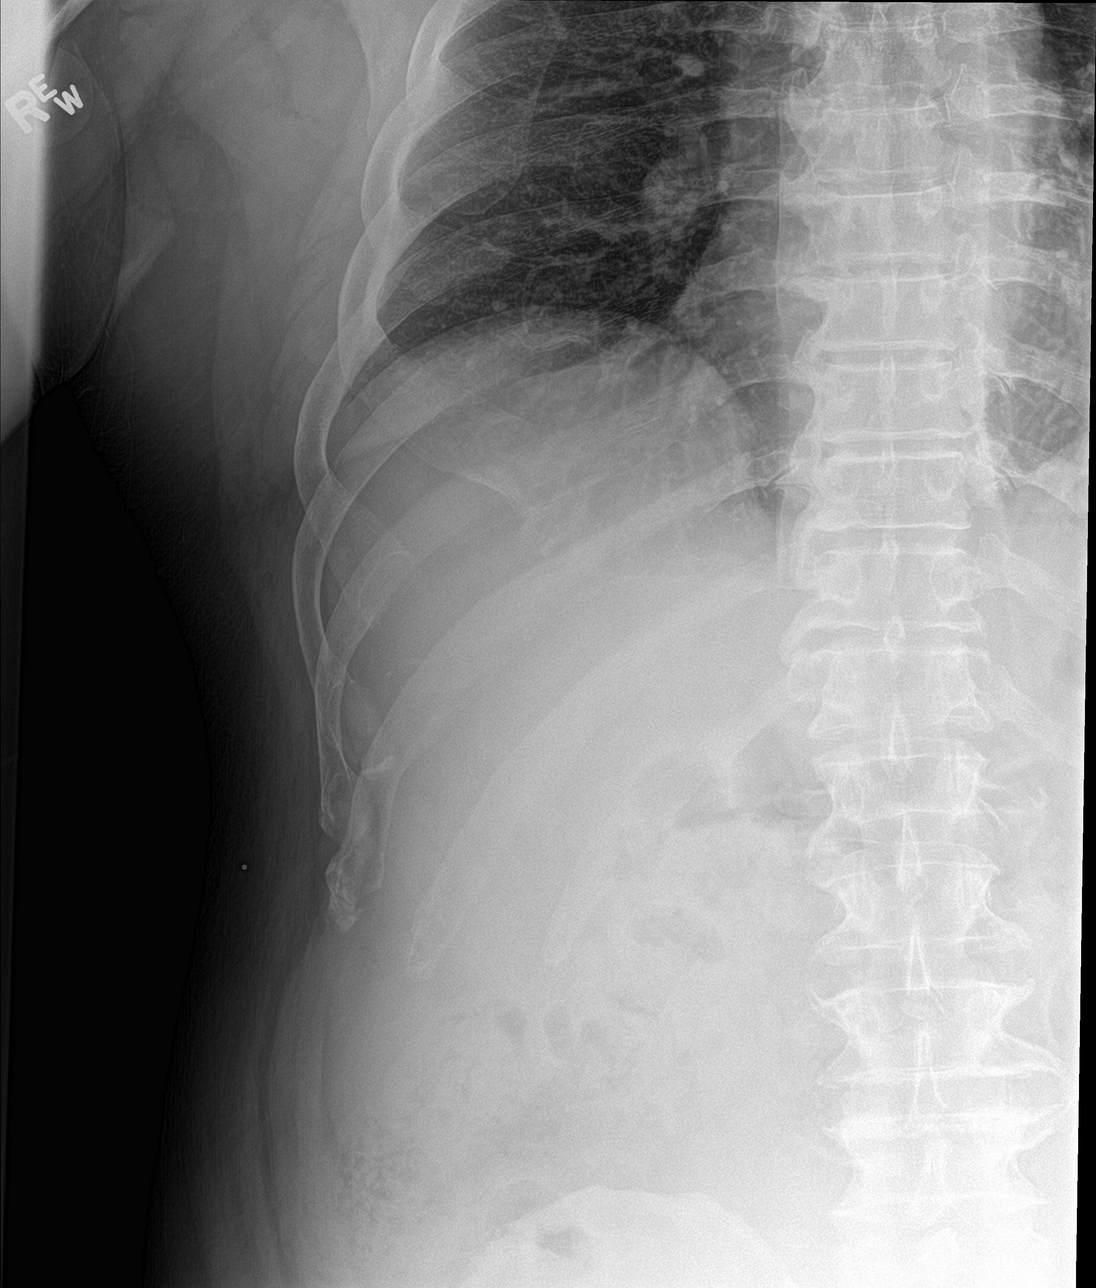

[rib ap (2 of 2)]
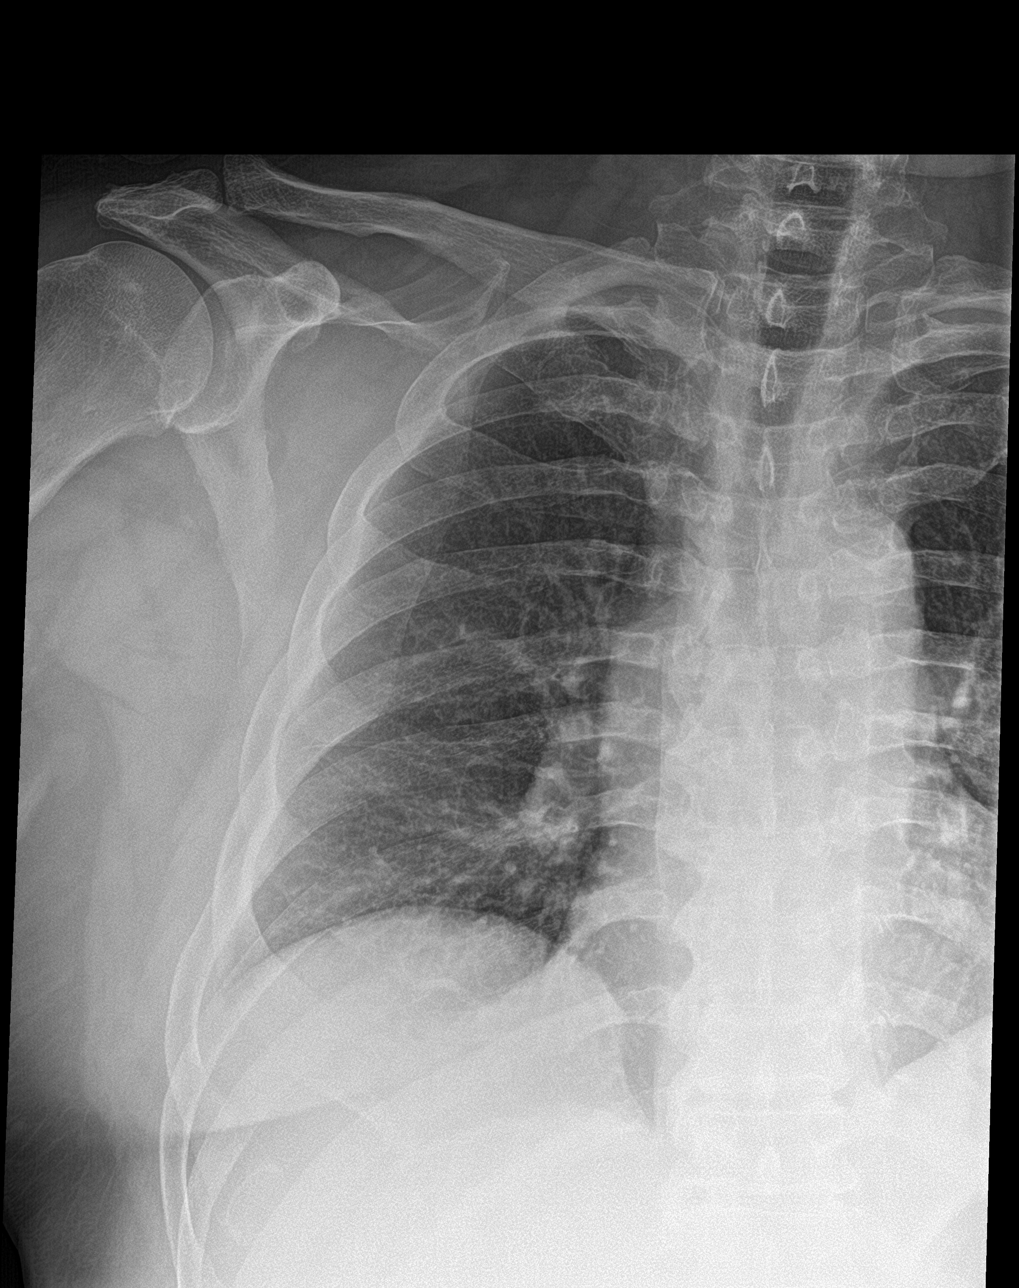

[rib ap obl]
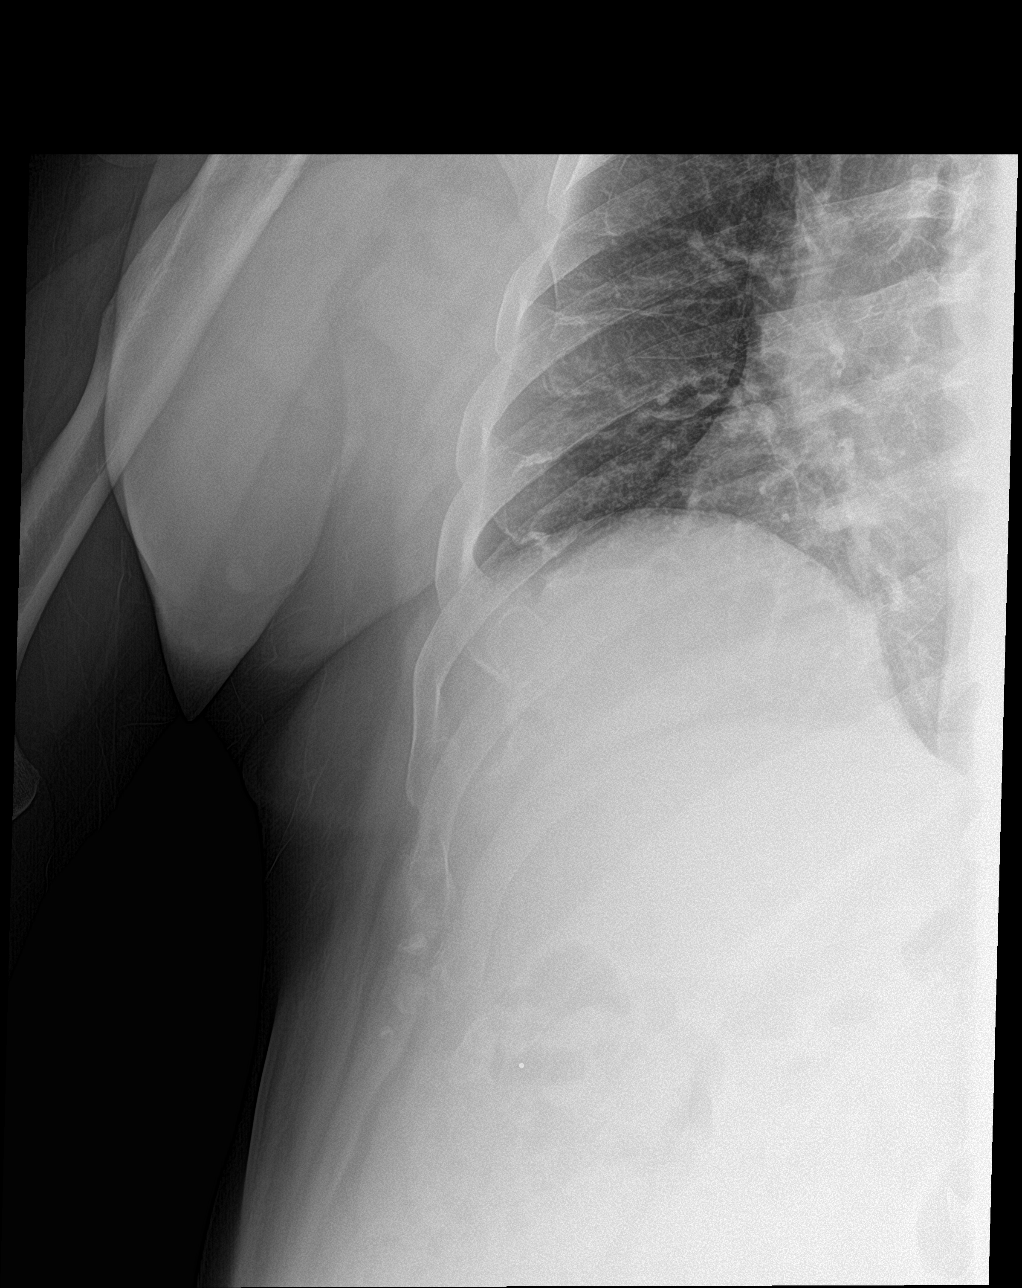

[4 of 4 positions shown; findings below may reference images not displayed]

FINDINGS: No evidence of fracture dislocation. No pneumothorax. Degenerative
changes thoracic spine and both shoulders. Carotid vascular
calcification.
IMPRESSION: 1. No acute abnormality. Degenerative changes thoracic spine and
both shoulders.

2. Carotid vascular disease.
